# Patient Record
Sex: Female | Born: 1985 | Race: White | Hispanic: No | Marital: Married | Smoking: Never smoker
Health system: Southern US, Community
[De-identification: ages and names within clinical notes are randomized; demographics above are authoritative.]

## PROBLEM LIST (undated history)

## (undated) DIAGNOSIS — M797 Fibromyalgia: Secondary | ICD-10-CM

## (undated) DIAGNOSIS — R011 Cardiac murmur, unspecified: Secondary | ICD-10-CM

## (undated) HISTORY — DX: Cardiac murmur, unspecified: R01.1

## (undated) HISTORY — PX: TONSILLECTOMY: SUR1361

---
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
## 2006-11-23 ENCOUNTER — Encounter: Admission: RE | Admit: 2006-11-23 | Discharge: 2006-11-23 | Payer: Self-pay | Admitting: Family Medicine

## 2011-12-09 ENCOUNTER — Encounter (HOSPITAL_COMMUNITY): Payer: Self-pay | Admitting: Emergency Medicine

## 2011-12-09 ENCOUNTER — Emergency Department (HOSPITAL_COMMUNITY)
Admission: EM | Admit: 2011-12-09 | Discharge: 2011-12-09 | Disposition: A | Discharge status: Home / Self Care | Attending: Emergency Medicine | Admitting: Emergency Medicine

## 2011-12-09 DIAGNOSIS — J329 Chronic sinusitis, unspecified: Secondary | ICD-10-CM | POA: Insufficient documentation

## 2011-12-09 DIAGNOSIS — J40 Bronchitis, not specified as acute or chronic: Secondary | ICD-10-CM

## 2011-12-09 HISTORY — DX: Fibromyalgia: M79.7

## 2011-12-09 MED ORDER — AZITHROMYCIN 250 MG PO TABS: ORAL_TABLET | ORAL | Status: AC

## 2011-12-09 NOTE — ED Notes (Signed)
Pt treated 8/26 for viral throat infection, no antibx. Since has had productive cough, green sputum.. Denies fever, throat sore from straining to talk and has laryngitis

## 2011-12-09 NOTE — ED Provider Notes (Signed)
History     CSN: [id number]  Arrival date & time 12/09/11  1022   First MD Initiated Contact with Patient 12/09/11 1139      Chief Complaint  Patient presents with  . Cough    productive green    (Consider location/radiation/quality/duration/timing/severity/associated sxs/prior treatment) HPI.... productive cough with green sputum for 2 days with associated nasal discharge. No fever, chills, stiff neck. Nothing makes symptoms better or worse. Severity is mild  Past Medical History  Diagnosis Date  . Fibromyalgia     Past Surgical History  Procedure Date  . Tonsillectomy     No family history on file.  History  Substance Use Topics  . Smoking status: Never Smoker   . Smokeless tobacco: Never Used  . Alcohol Use: No    OB History    Grav Para Term Preterm Abortions TAB SAB Ect Mult Living                  Review of Systems  All other systems reviewed and are negative.    Allergies  Review of patient's allergies indicates no known allergies.  Home Medications   Current Outpatient Rx  Name Route Sig Dispense Refill  . ADULT MULTIVITAMIN W/MINERALS CH Oral Take 1 tablet by mouth daily.    Marland Kitchen NORGESTIM-ETH ESTRAD TRIPHASIC 0.18/0.215/0.25 MG-35 MCG PO TABS Oral Take 1 tablet by mouth daily.    Marland Kitchen PREGABALIN 50 MG PO CAPS Oral Take 50 mg by mouth 3 (three) times daily.      BP 150/79  Pulse 97  Temp 98 F (36.7 C) (Oral)  Resp 18  Ht 5\' 4"  (1.626 m)  Wt 170 lb (77.111 kg)  BMI 29.18 kg/m2  SpO2 97%  LMP 11/11/2011  Physical Exam  Nursing note and vitals reviewed. Constitutional: She is oriented to person, place, and time. She appears well-developed and well-nourished.  HENT:  Head: Normocephalic and atraumatic.  Eyes: Conjunctivae normal and EOM are normal. Pupils are equal, round, and reactive to light.  Neck: Normal range of motion. Neck supple.  Cardiovascular: Normal rate, regular rhythm and normal heart sounds.   Pulmonary/Chest: Effort  normal and breath sounds normal.  Abdominal: Soft. Bowel sounds are normal.  Musculoskeletal: Normal range of motion.  Neurological: She is alert and oriented to person, place, and time.  Skin: Skin is warm and dry.  Psychiatric: She has a normal mood and affect.    ED Course  Procedures (including critical care time)  Labs Reviewed - No data to display No results found.   1. Bronchitis   2. Sinusitis       MDM  Could be mycoplasma. Her infant daughter is sick. Rx Zithromax        Donnetta Hutching, MD 12/09/11 1245

## 2012-11-26 ENCOUNTER — Ambulatory Visit (INDEPENDENT_AMBULATORY_CARE_PROVIDER_SITE_OTHER): Admitting: Internal Medicine

## 2012-11-26 VITALS — BP 140/80 | HR 99 | Temp 98.5°F | Resp 16 | Ht 65.0 in | Wt 174.6 lb

## 2012-11-26 DIAGNOSIS — J029 Acute pharyngitis, unspecified: Secondary | ICD-10-CM

## 2012-11-26 LAB — POCT RAPID STREP A (OFFICE): Rapid Strep A Screen: NEGATIVE

## 2012-11-26 NOTE — Progress Notes (Signed)
  Subjective:    Patient ID: Audrey Vazquez, female    DOB: 1985-09-02, 27 y.o.   MRN: [id number]  HPI here with concern because HER-9-year-old was diagnosed with strep today She has had a sore throat for 3 days but no fever runny nose or cough Also has a 27 year old at home who is healthy   Review of Systems     Objective:   Physical Exam BP 140/80  Pulse 99  Temp(Src) 98.5 F (36.9 C) (Oral)  Resp 16  Ht 5\' 5"  (1.651 m)  Wt 174 lb 9.6 oz (79.198 kg)  BMI 29.05 kg/m2  SpO2 100%  LMP 11/13/2012 HEENT clear except oropharynx with erythema, and no exudate. No a.c. nodes       Results for orders placed in visit on 11/26/12  POCT RAPID STREP A (OFFICE)      Result Value Range   Rapid Strep A Screen Negative  Negative    Assessment & Plan:  Pharyngitis  Throat culture sent No treatment for now/cough results

## 2012-11-29 LAB — CULTURE, GROUP A STREP: Organism ID, Bacteria: NORMAL

## 2014-10-12 ENCOUNTER — Encounter (HOSPITAL_COMMUNITY): Payer: Self-pay | Admitting: Emergency Medicine

## 2014-10-12 ENCOUNTER — Emergency Department (HOSPITAL_COMMUNITY)

## 2014-10-12 ENCOUNTER — Emergency Department (HOSPITAL_COMMUNITY)
Admission: EM | Admit: 2014-10-12 | Discharge: 2014-10-12 | Disposition: A | Discharge status: Home / Self Care | Attending: Emergency Medicine | Admitting: Emergency Medicine

## 2014-10-12 DIAGNOSIS — R011 Cardiac murmur, unspecified: Secondary | ICD-10-CM | POA: Insufficient documentation

## 2014-10-12 DIAGNOSIS — Y998 Other external cause status: Secondary | ICD-10-CM | POA: Diagnosis not present

## 2014-10-12 DIAGNOSIS — W228XXA Striking against or struck by other objects, initial encounter: Secondary | ICD-10-CM | POA: Insufficient documentation

## 2014-10-12 DIAGNOSIS — S0992XA Unspecified injury of nose, initial encounter: Secondary | ICD-10-CM | POA: Diagnosis not present

## 2014-10-12 DIAGNOSIS — S0031XA Abrasion of nose, initial encounter: Secondary | ICD-10-CM | POA: Diagnosis not present

## 2014-10-12 DIAGNOSIS — Y9289 Other specified places as the place of occurrence of the external cause: Secondary | ICD-10-CM | POA: Diagnosis not present

## 2014-10-12 DIAGNOSIS — Z79899 Other long term (current) drug therapy: Secondary | ICD-10-CM | POA: Diagnosis not present

## 2014-10-12 DIAGNOSIS — Y9389 Activity, other specified: Secondary | ICD-10-CM | POA: Diagnosis not present

## 2014-10-12 DIAGNOSIS — Z3202 Encounter for pregnancy test, result negative: Secondary | ICD-10-CM | POA: Diagnosis not present

## 2014-10-12 LAB — URINALYSIS, ROUTINE W REFLEX MICROSCOPIC
Bilirubin Urine: NEGATIVE
GLUCOSE, UA: NEGATIVE mg/dL
HGB URINE DIPSTICK: NEGATIVE
Ketones, ur: 15 mg/dL — AB
LEUKOCYTES UA: NEGATIVE
Nitrite: NEGATIVE
PH: 8 (ref 5.0–8.0)
Protein, ur: 30 mg/dL — AB
SPECIFIC GRAVITY, URINE: 1.021 (ref 1.005–1.030)
UROBILINOGEN UA: 1 mg/dL (ref 0.0–1.0)

## 2014-10-12 LAB — URINE MICROSCOPIC-ADD ON

## 2014-10-12 LAB — CBC WITH DIFFERENTIAL/PLATELET
Basophils Absolute: 0.1 10*3/uL (ref 0.0–0.1)
Basophils Relative: 1 % (ref 0–1)
EOS ABS: 0.5 10*3/uL (ref 0.0–0.7)
Eosinophils Relative: 3 % (ref 0–5)
HCT: 39.8 % (ref 36.0–46.0)
Hemoglobin: 13 g/dL (ref 12.0–15.0)
LYMPHS PCT: 17 % (ref 12–46)
Lymphs Abs: 2.7 10*3/uL (ref 0.7–4.0)
MCH: 28 pg (ref 26.0–34.0)
MCHC: 32.7 g/dL (ref 30.0–36.0)
MCV: 85.8 fL (ref 78.0–100.0)
Monocytes Absolute: 0.7 10*3/uL (ref 0.1–1.0)
Monocytes Relative: 5 % (ref 3–12)
NEUTROS PCT: 74 % (ref 43–77)
Neutro Abs: 12 10*3/uL — ABNORMAL HIGH (ref 1.7–7.7)
PLATELETS: 408 10*3/uL — AB (ref 150–400)
RBC: 4.64 MIL/uL (ref 3.87–5.11)
RDW: 13 % (ref 11.5–15.5)
WBC: 15.9 10*3/uL — AB (ref 4.0–10.5)

## 2014-10-12 LAB — BASIC METABOLIC PANEL
Anion gap: 8 (ref 5–15)
BUN: 14 mg/dL (ref 6–20)
CHLORIDE: 107 mmol/L (ref 101–111)
CO2: 25 mmol/L (ref 22–32)
Calcium: 9.5 mg/dL (ref 8.9–10.3)
Creatinine, Ser: 0.8 mg/dL (ref 0.44–1.00)
GLUCOSE: 93 mg/dL (ref 65–99)
Potassium: 4.3 mmol/L (ref 3.5–5.1)
Sodium: 140 mmol/L (ref 135–145)

## 2014-10-12 LAB — PREGNANCY, URINE: PREG TEST UR: NEGATIVE

## 2014-10-12 MED ORDER — TRAMADOL HCL 50 MG PO TABS: 50.0000 mg | ORAL_TABLET | Freq: Once | ORAL | Status: DC

## 2014-10-12 MED ORDER — ACETAMINOPHEN 325 MG PO TABS
650.0000 mg | ORAL_TABLET | Freq: Once | ORAL | Status: AC
  Administered 2014-10-12: 650 mg via ORAL
  Filled 2014-10-12: qty 2
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
## 2014-10-12 NOTE — Discharge Instructions (Signed)
Take tylenol as needed for pain. Apply ice to your injury. Refer to attached documents for more information.

## 2014-10-12 NOTE — ED Notes (Signed)
Pt feels weak still but has no nausea to report at present.
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
